# Patient Record
Sex: Female | Born: 1975 | Race: Black or African American | Hispanic: No | Marital: Single | Smoking: Never smoker
Health system: Southern US, Community
[De-identification: ages and names within clinical notes are randomized; demographics above are authoritative.]

## PROBLEM LIST (undated history)

## (undated) DIAGNOSIS — M5136 Other intervertebral disc degeneration, lumbar region: Secondary | ICD-10-CM

## (undated) DIAGNOSIS — M5126 Other intervertebral disc displacement, lumbar region: Secondary | ICD-10-CM

## (undated) DIAGNOSIS — N200 Calculus of kidney: Secondary | ICD-10-CM

## (undated) DIAGNOSIS — M51369 Other intervertebral disc degeneration, lumbar region without mention of lumbar back pain or lower extremity pain: Secondary | ICD-10-CM

## (undated) HISTORY — PX: ROTATOR CUFF REPAIR: SHX139

---

## 2015-03-28 ENCOUNTER — Emergency Department (HOSPITAL_COMMUNITY): Payer: Medicaid - Out of State

## 2015-03-28 ENCOUNTER — Emergency Department (HOSPITAL_COMMUNITY)
Admission: EM | Admit: 2015-03-28 | Discharge: 2015-03-28 | Disposition: A | Discharge status: Home / Self Care | Payer: Medicaid - Out of State | Attending: Emergency Medicine | Admitting: Emergency Medicine

## 2015-03-28 ENCOUNTER — Encounter (HOSPITAL_COMMUNITY): Payer: Self-pay | Admitting: Emergency Medicine

## 2015-03-28 DIAGNOSIS — Z880 Allergy status to penicillin: Secondary | ICD-10-CM | POA: Insufficient documentation

## 2015-03-28 DIAGNOSIS — Z79899 Other long term (current) drug therapy: Secondary | ICD-10-CM | POA: Insufficient documentation

## 2015-03-28 DIAGNOSIS — Z87442 Personal history of urinary calculi: Secondary | ICD-10-CM | POA: Diagnosis not present

## 2015-03-28 DIAGNOSIS — M545 Low back pain, unspecified: Secondary | ICD-10-CM

## 2015-03-28 DIAGNOSIS — M5126 Other intervertebral disc displacement, lumbar region: Secondary | ICD-10-CM | POA: Insufficient documentation

## 2015-03-28 HISTORY — DX: Other intervertebral disc degeneration, lumbar region: M51.36

## 2015-03-28 HISTORY — DX: Calculus of kidney: N20.0

## 2015-03-28 HISTORY — DX: Other intervertebral disc degeneration, lumbar region without mention of lumbar back pain or lower extremity pain: M51.369

## 2015-03-28 HISTORY — DX: Other intervertebral disc displacement, lumbar region: M51.26

## 2015-03-28 LAB — URINALYSIS, ROUTINE W REFLEX MICROSCOPIC
Bilirubin Urine: NEGATIVE
Glucose, UA: NEGATIVE mg/dL
Ketones, ur: NEGATIVE mg/dL
Leukocytes, UA: NEGATIVE
NITRITE: NEGATIVE
PROTEIN: NEGATIVE mg/dL
Specific Gravity, Urine: 1.009 (ref 1.005–1.030)
pH: 6.5 (ref 5.0–8.0)

## 2015-03-28 LAB — CBC
HEMATOCRIT: 36.4 % (ref 36.0–46.0)
HEMOGLOBIN: 12.5 g/dL (ref 12.0–15.0)
MCH: 32.3 pg (ref 26.0–34.0)
MCHC: 34.3 g/dL (ref 30.0–36.0)
MCV: 94.1 fL (ref 78.0–100.0)
Platelets: 273 10*3/uL (ref 150–400)
RBC: 3.87 MIL/uL (ref 3.87–5.11)
RDW: 12.4 % (ref 11.5–15.5)
WBC: 7.5 10*3/uL (ref 4.0–10.5)

## 2015-03-28 LAB — BASIC METABOLIC PANEL
Anion gap: 8 (ref 5–15)
BUN: <5 mg/dL — ABNORMAL LOW (ref 6–20)
CALCIUM: 9.3 mg/dL (ref 8.9–10.3)
CO2: 25 mmol/L (ref 22–32)
Chloride: 108 mmol/L (ref 101–111)
Creatinine, Ser: 0.63 mg/dL (ref 0.44–1.00)
GLUCOSE: 93 mg/dL (ref 65–99)
POTASSIUM: 3.9 mmol/L (ref 3.5–5.1)
SODIUM: 141 mmol/L (ref 135–145)

## 2015-03-28 LAB — I-STAT CG4 LACTIC ACID, ED
LACTIC ACID, VENOUS: 1.45 mmol/L (ref 0.5–2.0)
LACTIC ACID, VENOUS: 1.76 mmol/L (ref 0.5–2.0)

## 2015-03-28 LAB — URINE MICROSCOPIC-ADD ON

## 2015-03-28 LAB — I-STAT BETA HCG BLOOD, ED (MC, WL, AP ONLY)

## 2015-03-28 MED ORDER — OXYCODONE-ACETAMINOPHEN 5-325 MG PO TABS: 1.0000 | ORAL_TABLET | ORAL | Status: AC | PRN

## 2015-03-28 MED ORDER — OXYCODONE-ACETAMINOPHEN 5-325 MG PO TABS
1.0000 | ORAL_TABLET | Freq: Once | ORAL | Status: AC
  Administered 2015-03-28: 1 via ORAL
  Filled 2015-03-28: qty 1

## 2015-03-28 MED ORDER — OXYCODONE-ACETAMINOPHEN 5-325 MG PO TABS
ORAL_TABLET | ORAL | Status: AC
  Filled 2015-03-28: qty 1

## 2015-03-28 MED ORDER — KETOROLAC TROMETHAMINE 60 MG/2ML IM SOLN
60.0000 mg | Freq: Once | INTRAMUSCULAR | Status: AC
  Administered 2015-03-28: 60 mg via INTRAMUSCULAR
  Filled 2015-03-28: qty 2

## 2015-03-28 MED ORDER — OXYCODONE-ACETAMINOPHEN 5-325 MG PO TABS
1.0000 | ORAL_TABLET | Freq: Once | ORAL | Status: AC
  Administered 2015-03-28: 1 via ORAL

## 2015-03-28 MED ORDER — PREDNISONE 20 MG PO TABS: 40.0000 mg | ORAL_TABLET | Freq: Every day | ORAL | Status: DC

## 2015-03-28 NOTE — ED Notes (Signed)
Her pain is not much better.  She still appears to be upset.

## 2015-03-28 NOTE — ED Notes (Signed)
The pt returned from mri 

## 2015-03-28 NOTE — ED Notes (Signed)
To mri 

## 2015-03-28 NOTE — ED Notes (Signed)
Pt still crying.  

## 2015-03-28 NOTE — ED Notes (Signed)
The pt is crying  She is in pain but she just found a job  And she has not worked many days before this happened.  Pain meds given

## 2015-03-28 NOTE — ED Notes (Signed)
Pt crying -- due to pain, flank pain, with back pain and numbness down right leg. Has hx of kidney stones, hx of "bulging discs"

## 2015-03-28 NOTE — Discharge Instructions (Signed)
Take the prescribed medication as directed. Follow-up with Dr. Jeral FruitBotero (neurosurgery)-- call tomorrow morning to make appt. Return to the ED for new or worsening symptoms.

## 2015-03-28 NOTE — ED Provider Notes (Signed)
Patient received in sign out from Georgia Nadeau at shift change.  Briefly, 39 year old female here with low back pain, radiation into right leg.  Symptoms suspicious for sciatica.  No bowel/bladder changes but does appear to have some weakness on the right vs poor effort due to pain.  Plan:  Obtain MRI lumbar spine.  If negative d/c home with pain meds.  Will need to ambulate prior to discharge.  Results for orders placed or performed during the hospital encounter of 03/28/15  Urinalysis, Routine w reflex microscopic-may I&O cath if menses (not at [facility])  Result Value Ref Range   Color, Urine YELLOW YELLOW   APPearance HAZY (A) CLEAR   Specific Gravity, Urine 1.009 1.005 - 1.030   pH 6.5 5.0 - 8.0   Glucose, UA NEGATIVE NEGATIVE mg/dL   Hgb urine dipstick SMALL (A) NEGATIVE   Bilirubin Urine NEGATIVE NEGATIVE   Ketones, ur NEGATIVE NEGATIVE mg/dL   Protein, ur NEGATIVE NEGATIVE mg/dL   Nitrite NEGATIVE NEGATIVE   Leukocytes, UA NEGATIVE NEGATIVE  Basic metabolic panel  Result Value Ref Range   Sodium 141 135 - 145 mmol/L   Potassium 3.9 3.5 - 5.1 mmol/L   Chloride 108 101 - 111 mmol/L   CO2 25 22 - 32 mmol/L   Glucose, Bld 93 65 - 99 mg/dL   BUN <5 (L) 6 - 20 mg/dL   Creatinine, Ser 1.61 0.44 - 1.00 mg/dL   Calcium 9.3 8.9 - 09.6 mg/dL   GFR calc non Af Amer >60 >60 mL/min   GFR calc Af Amer >60 >60 mL/min   Anion gap 8 5 - 15  CBC  Result Value Ref Range   WBC 7.5 4.0 - 10.5 K/uL   RBC 3.87 3.87 - 5.11 MIL/uL   Hemoglobin 12.5 12.0 - 15.0 g/dL   HCT 04.5 40.9 - 81.1 %   MCV 94.1 78.0 - 100.0 fL   MCH 32.3 26.0 - 34.0 pg   MCHC 34.3 30.0 - 36.0 g/dL   RDW 91.4 78.2 - 95.6 %   Platelets 273 150 - 400 K/uL  Urine microscopic-add on  Result Value Ref Range   Squamous Epithelial / LPF 6-30 (A) NONE SEEN   WBC, UA 0-5 0 - 5 WBC/hpf   RBC / HPF 0-5 0 - 5 RBC/hpf   Bacteria, UA RARE (A) NONE SEEN  I-Stat beta hCG blood, ED ([facility], [facility], [facility] only)  Result Value Ref Range   I-stat hCG,  quantitative <5.0 <5 mIU/mL   Comment 3          I-Stat CG4 Lactic Acid, ED  Result Value Ref Range   Lactic Acid, Venous 1.45 0.5 - 2.0 mmol/L  I-Stat CG4 Lactic Acid, ED  Result Value Ref Range   Lactic Acid, Venous 1.76 0.5 - 2.0 mmol/L   Dg Lumbar Spine Complete  03/28/2015  CLINICAL DATA:  Patient with flank pain as well as back pain and numbness down the right leg. History of renal stones. EXAM: LUMBAR SPINE - COMPLETE 4+ VIEW COMPARISON:  None. FINDINGS: Normal anatomic alignment. No evidence for acute fracture dislocation. Vertebral body and intervertebral disc space heights are preserved. SI joints are unremarkable. IMPRESSION: No acute osseous abnormality. Electronically Signed   By: Annia Belt M.D.   On: 03/28/2015 19:18   Mr Lumbar Spine Wo Contrast  03/28/2015  CLINICAL DATA:  39 year female with severe lumbar back and flank pain radiating to the right lower extremity since yesterday after pushing a  cart. Spinal injection several weeks ago. Subsequent encounter. EXAM: MRI LUMBAR SPINE WITHOUT CONTRAST TECHNIQUE: Multiplanar, multisequence MR imaging of the lumbar spine was performed. No intravenous contrast was administered. COMPARISON:  Lumbar radiographs 1848 hours today. FINDINGS: Normal lumbar segmentation demonstrated on the comparison today. Normal vertebral height and alignment. No marrow edema or evidence of acute osseous abnormality. Visualized lower thoracic spinal cord is normal with conus medularis at L1-L2. Negative cauda equina nerve roots. Visualized abdominal viscera and paraspinal soft tissues are within normal limits. Visualized kidneys and proximal ureters are normal. T11-T12:  Negative. T12-L1:  Negative. L1-L2:  Negative. L2-L3: Negative disc. Borderline to mild facet hypertrophy. No stenosis. L3-L4:  Negative disc.  Mild facet hypertrophy.  No stenosis. L4-L5: Subtle left subarticular disc protrusion best seen on series 3, image 9. Mild facet and ligament flavum  hypertrophy. No stenosis. L5-S1: Negative disc. Borderline to mild facet hypertrophy. No stenosis. 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 Mild facet degeneration. No lumbar spinal stenosis or source of right side radiculitis identified. Electronically Signed   By: Odessa FlemingH  Hall M.D.   On: 03/28/2015 21:09    MRI with L4-L5 disc protrusion to the left, which is somewhat unusual since all of her symptoms are on the right.  She remains without any bowel/bladder incontinence.  I have discussed patient's results with her-- she states she is aware of the disc herniation on left but has never seen a specialist.  Patient was able to ambulate in room with myself and nurse tech.  States it is painful but she is able to walk and sit upright which she could not do earlier.  Will d/c home with pain meds, prednisone.  Referred to neurosurgery.  Discussed plan with patient, he/she acknowledged understanding and agreed with plan of care.  Return precautions given for new or worsening symptoms.  Garlon HatchetLisa M Caylee Vlachos, PA-C 03/28/15 [id number]  Zadie Rhineonald Wickline, MD 03/28/15 [phone]

## 2015-03-28 NOTE — ED Provider Notes (Signed)
CSN: [id number]     Arrival date & time 03/28/15  1338 History   First MD Initiated Contact with Patient 03/28/15 1759     Chief Complaint  Patient presents with  . Flank Pain  . Back Pain     (Consider location/radiation/quality/duration/timing/severity/associated sxs/prior Treatment) HPI   Patient is a 39 year old female with past medical history of kidney stones and sciatica who presents to the ED with complaint of worsening back pain. Patient reports yesterday she started having worsening constant sharp pain to her lower back and notes that the pain radiates down the backside of her right leg. She notes pain is worse with movement or putting pressure on her right buttocks/leg. Pt denies fever, numbness, tingling, saddle anesthesia, loss of bowel or bladder, abdominal pain, nausea, vomiting, diarrhea, constipation, weakness, IVDU, cancer or recent spinal manipulation. Denies taking any medications prior to arrival. Patient reports her pain feels similar to episodes of sciatica she has had in the past but notes that this episode is severely worse. Denies any recent fall, trauma, injury. 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  Past Medical History  Diagnosis Date  . Bulging lumbar disc   . Kidney stone    Past Surgical History  Procedure Laterality Date  . Rotator cuff repair     No family history on file. Social History  Substance Use Topics  . Smoking status: Never Smoker   . Smokeless tobacco: None  . Alcohol Use: No   OB History    No data available     Review of Systems  Musculoskeletal: Positive for back pain.  Neurological: Positive for weakness and numbness.      Allergies  Penicillins  Home Medications   Prior to Admission medications   Medication Sig Start Date End Date Taking? Authorizing Provider  cyclobenzaprine (FLEXERIL) 10 MG tablet Take 10 mg by mouth 3 (three) times daily as needed for muscle spasms.   Yes  Historical Provider, MD  nortriptyline (PAMELOR) 25 MG capsule Take 25 mg by mouth at bedtime.   Yes Historical Provider, MD   BP 96/63 mmHg  Pulse 86  Temp(Src) 98.8 F (37.1 C) (Rectal)  Resp 20  SpO2 100% Physical Exam  Constitutional: She is oriented to person, place, and time. She appears well-developed and well-nourished.  Pt appears to be in mild discomfort.  HENT:  Head: Normocephalic and atraumatic.  Mouth/Throat: Oropharynx is clear and moist. No oropharyngeal exudate.  Eyes: Conjunctivae and EOM are normal. Pupils are equal, round, and reactive to light. Right eye exhibits no discharge. Left eye exhibits no discharge. No scleral icterus.  Neck: Normal range of motion. Neck supple.  Cardiovascular: Normal rate, regular rhythm, normal heart sounds and intact distal pulses.   Pulmonary/Chest: Effort normal and breath sounds normal. No respiratory distress. She has no wheezes. She has no rales. She exhibits no tenderness.  Abdominal: Soft. Bowel sounds are normal. She exhibits no distension and no mass. There is no tenderness. There is no rebound, no guarding and no CVA tenderness.  Musculoskeletal: She exhibits tenderness. She exhibits no edema.  No C/T midline tenderness. TTP at midline lumbar and bilateral paraspinal muscles. Dec ROM of back due to pain. Pt only able to slightly lift her right left up from the bed due to reported severe pain, she is able to wiggle toes and flex/extend ankle, sensation intact. 2+ PT pulses. Cap refill <2.   Lymphadenopathy:  She has no cervical adenopathy.  Neurological: She is alert and oriented to person, place, and time.  Skin: Skin is warm and dry.  Nursing note and vitals reviewed.   ED Course  Procedures (including critical care time) Labs Review Labs Reviewed  URINALYSIS, ROUTINE W REFLEX MICROSCOPIC (NOT AT [facility]) - Abnormal; Notable for the following:    APPearance HAZY (*)    Hgb urine dipstick SMALL (*)    All other components  within normal limits  BASIC METABOLIC PANEL - Abnormal; Notable for the following:    BUN <5 (*)    All other components within normal limits  URINE MICROSCOPIC-ADD ON - Abnormal; Notable for the following:    Squamous Epithelial / LPF 6-30 (*)    Bacteria, UA RARE (*)    All other components within normal limits  URINE CULTURE  CBC  I-STAT BETA HCG BLOOD, ED ([facility], [facility], [facility] ONLY)  I-STAT CG4 LACTIC ACID, ED  I-STAT CG4 LACTIC ACID, ED    Imaging Review Dg Lumbar Spine Complete  03/28/2015  CLINICAL DATA:  Patient with flank pain as well as back pain and numbness down the right leg. History of renal stones. EXAM: LUMBAR SPINE - COMPLETE 4+ VIEW COMPARISON:  None. FINDINGS: Normal anatomic alignment. No evidence for acute fracture dislocation. Vertebral body and intervertebral disc space heights are preserved. SI joints are unremarkable. IMPRESSION: No acute osseous abnormality. Electronically Signed   By: Annia Beltrew  Davis M.D.   On: 03/28/2015 19:18   I have personally reviewed and evaluated these images and lab results as part of my medical decision-making.    MDM   Final diagnoses:  Lower back pain    Patient presents with worsening lower back pain that radiates down her right leg with associated numbness. Denies any recent fall, trauma, injury. Endorses history of sciatica and reports this episode of pain feels similar however she notes is significantly worse than episode she has had in the past. No back pain red flags. VSS. Exam revealed tenderness at midline lumbar spine and bilateral lumbar paraspinal muscles, decreased strength and range of motion noted to right lower extremity due to reported pain, sensation intact, 2+ DP pulses, cap refill less than 2, no saddle anesthesia. Patient given pain meds in the ED. pregnancy negative. Labs and UA unremarkable. Lumbar spine x-ray revealed no acute abnormality. Due to pt's presentation and dec. weakness noted on exam, will order MRI for further  evaluation.  Hand-off to Sharilyn SitesLisa Sanders, PA-C. MRI pending.     Satira Sarkicole Elizabeth Paint RockNadeau, [place] JerseyPA-C 03/28/15 2031  Zadie Rhineonald Wickline, MD 03/28/15 2120

## 2015-03-29 LAB — URINE CULTURE: SPECIAL REQUESTS: NORMAL

## 2016-09-27 IMAGING — MR MR LUMBAR SPINE W/O CM
4 of 5 series · 19 of 48 positions shown · non-contrast
Comparison: Lumbar radiographs 6808 hours today.

CLINICAL DATA: 39 year female with severe lumbar back and flank
pain radiating to the right lower extremity since yesterday after
pushing a cart. Spinal injection several weeks ago. Subsequent
encounter.

EXAM:
MRI LUMBAR SPINE WITHOUT CONTRAST
TECHNIQUE: Multiplanar, multisequence MR imaging of the lumbar spine was
performed. No intravenous contrast was administered.

[Series 3: T2 · sagittal · 4.0mm · 0.55mm/px · 5 of 13 slices shown (1 of 2)]
[im 1/13]
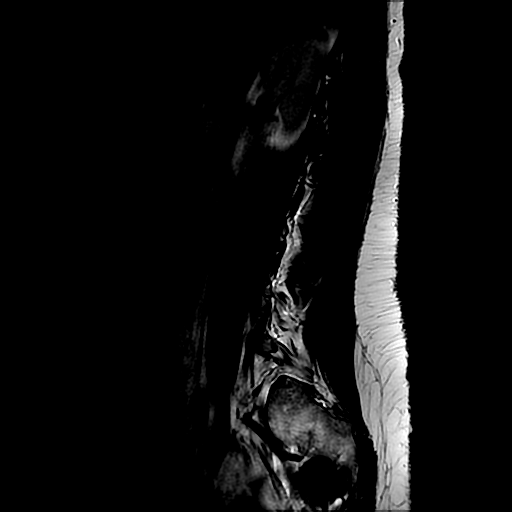
[im 4/13]
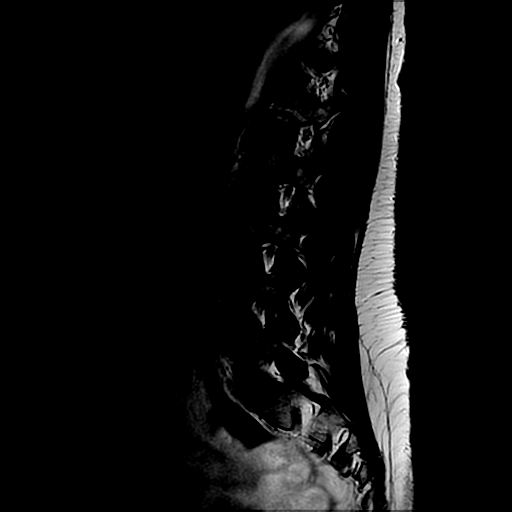
[im 7/13]
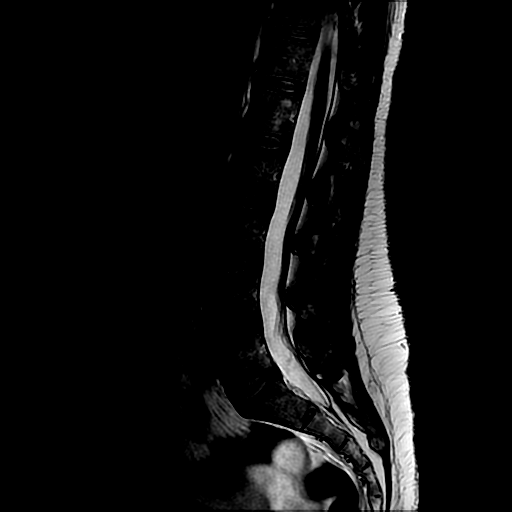
[im 10/13]
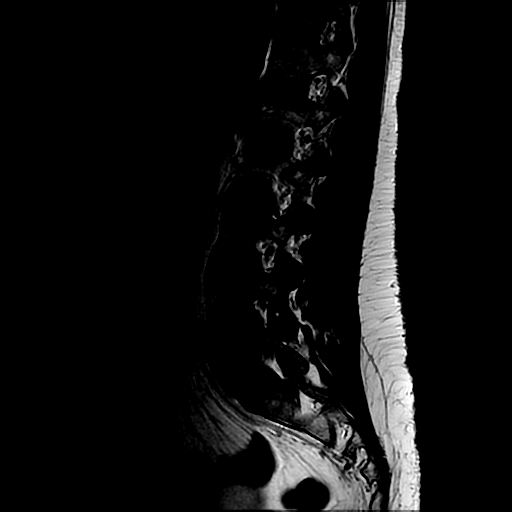
[im 13/13]
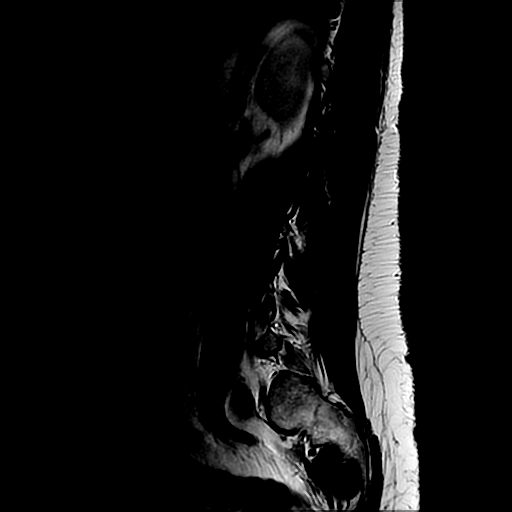

[Series 4: T1 · sagittal · 4.0mm · 0.55mm/px · 3 of 13 slices shown (1 of 2)]
[im 1/13]
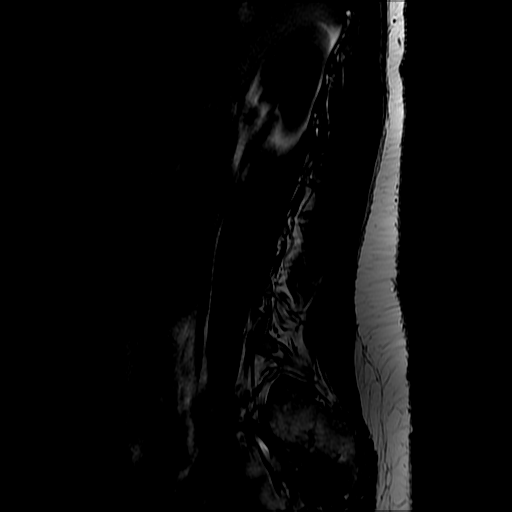
[im 7/13]
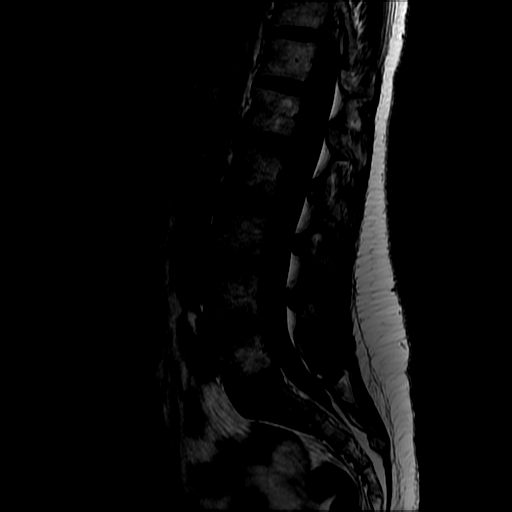
[im 13/13]
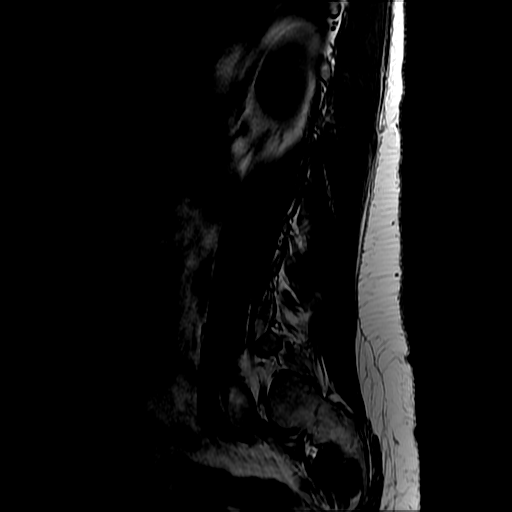

[Series 6: T2 · axial · 4.0mm · 0.39mm/px · z∈[-110,+61]mm · 8 of 36 slices shown (2 of 2)]
[im 3/36]
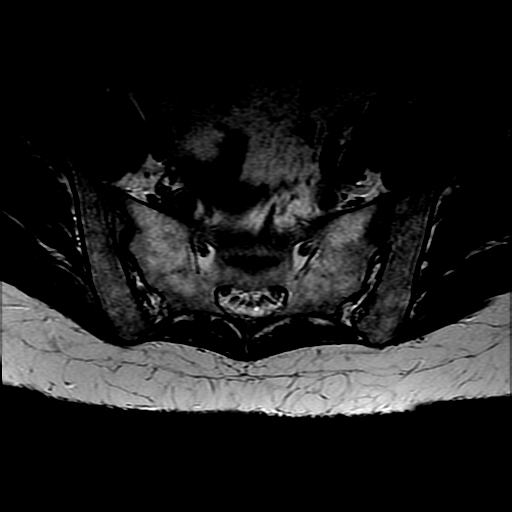
[im 5/36]
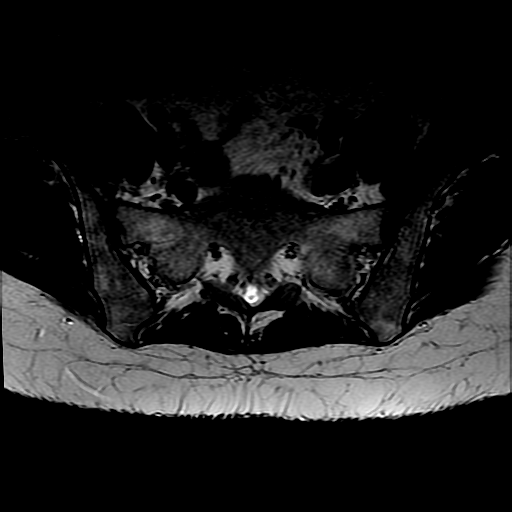
[im 8/36]
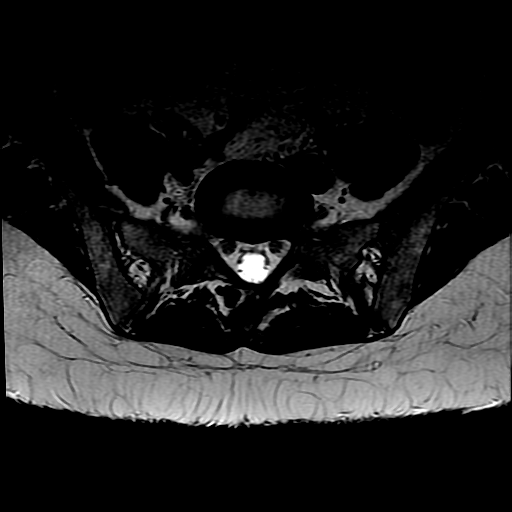
[im 12/36]
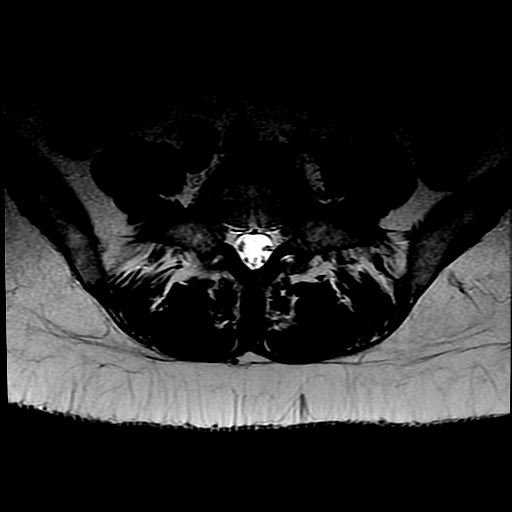
[im 17/36]
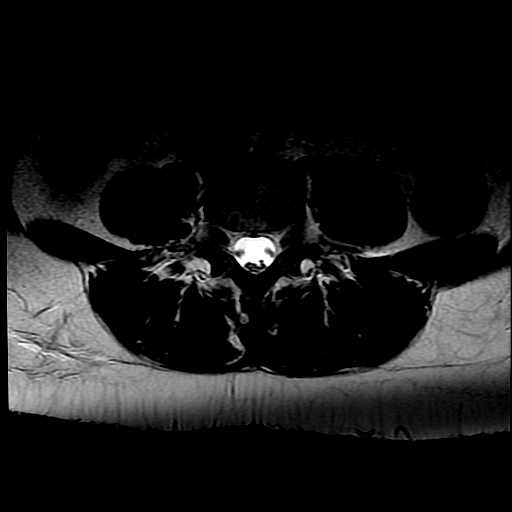
[im 19/36]
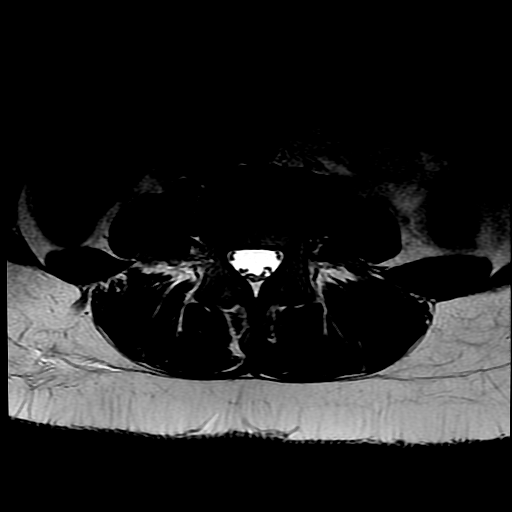
[im 22/36]
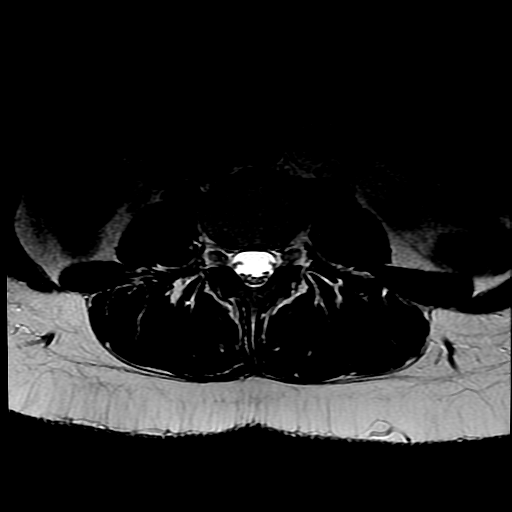
[im 31/36]
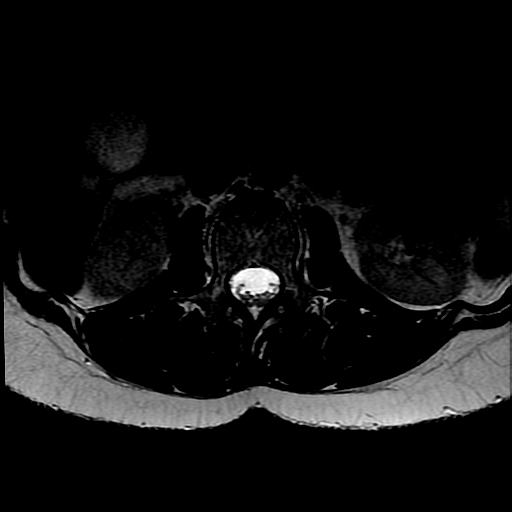

[Series 7: T1 · axial · 4.0mm · 0.39mm/px · z∈[-100,+61]mm · 3 of 36 slices shown (2 of 2)]
[im 5/36]
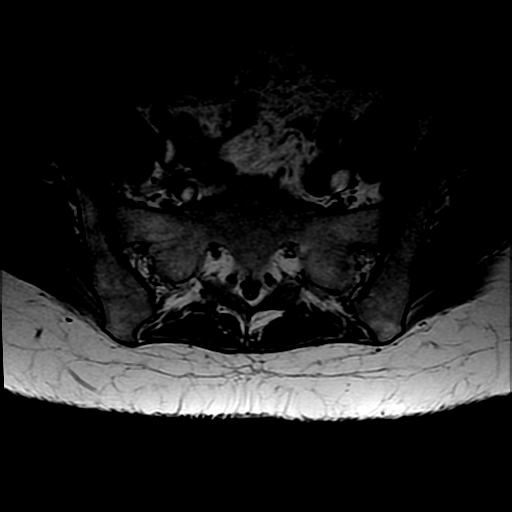
[im 19/36]
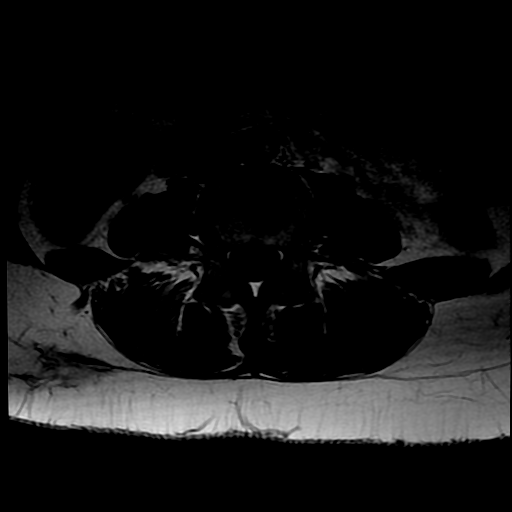
[im 31/36]
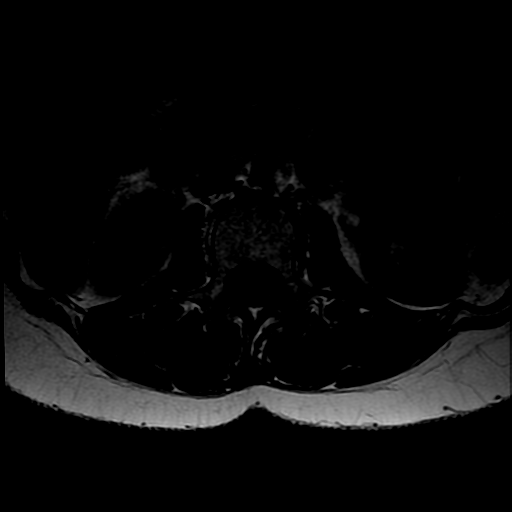

[19 of 48 positions shown; findings below may reference images not displayed]

FINDINGS: Normal lumbar segmentation demonstrated on the comparison today.
Normal vertebral height and alignment. No marrow edema or evidence
of acute osseous abnormality.

Visualized lower thoracic spinal cord is normal with conus medularis
at L1-L2. Negative cauda equina nerve roots.

Visualized abdominal viscera and paraspinal soft tissues are within
normal limits. Visualized kidneys and proximal ureters are normal.

T11-T12:  Negative.

T12-L1:  Negative.

L1-L2:  Negative.

L2-L3: Negative disc. Borderline to mild facet hypertrophy. No
stenosis.

L3-L4:  Negative disc.  Mild facet hypertrophy.  No stenosis.

L4-L5: Subtle left subarticular disc protrusion best seen on series
3, image 9. Mild facet and ligament flavum hypertrophy. No stenosis.

L5-S1: Negative disc. Borderline to mild facet hypertrophy. No
stenosis.
IMPRESSION: Isolated minimal lumbar disc degeneration at L4-L5, with a subtle
left subarticular disc protrusion which might be a source for left
L4 or L5 radiculitis. Mild facet degeneration.

No lumbar spinal stenosis or source of right side radiculitis
identified.

## 2017-04-07 DIAGNOSIS — M461 Sacroiliitis, not elsewhere classified: Secondary | ICD-10-CM | POA: Diagnosis not present

## 2017-04-07 DIAGNOSIS — M545 Low back pain: Secondary | ICD-10-CM | POA: Diagnosis not present

## 2017-04-07 DIAGNOSIS — G8929 Other chronic pain: Secondary | ICD-10-CM | POA: Diagnosis not present

## 2017-06-15 DIAGNOSIS — S6391XA Sprain of unspecified part of right wrist and hand, initial encounter: Secondary | ICD-10-CM | POA: Diagnosis not present

## 2017-06-15 DIAGNOSIS — M25531 Pain in right wrist: Secondary | ICD-10-CM | POA: Diagnosis not present

## 2017-06-15 DIAGNOSIS — M79641 Pain in right hand: Secondary | ICD-10-CM | POA: Diagnosis not present

## 2017-09-18 DIAGNOSIS — M5416 Radiculopathy, lumbar region: Secondary | ICD-10-CM | POA: Diagnosis not present

## 2017-09-18 DIAGNOSIS — M2550 Pain in unspecified joint: Secondary | ICD-10-CM | POA: Diagnosis not present

## 2017-09-18 DIAGNOSIS — E048 Other specified nontoxic goiter: Secondary | ICD-10-CM | POA: Diagnosis not present

## 2017-09-18 DIAGNOSIS — G5700 Lesion of sciatic nerve, unspecified lower limb: Secondary | ICD-10-CM | POA: Diagnosis not present

## 2017-09-18 DIAGNOSIS — G4700 Insomnia, unspecified: Secondary | ICD-10-CM | POA: Diagnosis not present

## 2017-09-22 ENCOUNTER — Other Ambulatory Visit: Payer: Self-pay | Admitting: Endocrinology

## 2017-09-22 DIAGNOSIS — M545 Low back pain, unspecified: Secondary | ICD-10-CM

## 2017-09-22 DIAGNOSIS — G5700 Lesion of sciatic nerve, unspecified lower limb: Secondary | ICD-10-CM

## 2017-10-12 ENCOUNTER — Other Ambulatory Visit: Payer: Medicaid - Out of State

## 2017-12-21 DIAGNOSIS — Z0000 Encounter for general adult medical examination without abnormal findings: Secondary | ICD-10-CM | POA: Diagnosis not present

## 2018-01-10 ENCOUNTER — Emergency Department (HOSPITAL_COMMUNITY)
Admission: EM | Admit: 2018-01-10 | Discharge: 2018-01-10 | Disposition: A | Discharge status: Home / Self Care | Payer: BLUE CROSS/BLUE SHIELD | Attending: Emergency Medicine | Admitting: Emergency Medicine

## 2018-01-10 ENCOUNTER — Encounter (HOSPITAL_COMMUNITY): Payer: Self-pay | Admitting: Internal Medicine

## 2018-01-10 DIAGNOSIS — M549 Dorsalgia, unspecified: Secondary | ICD-10-CM | POA: Diagnosis present

## 2018-01-10 DIAGNOSIS — M5489 Other dorsalgia: Secondary | ICD-10-CM | POA: Diagnosis not present

## 2018-01-10 DIAGNOSIS — R52 Pain, unspecified: Secondary | ICD-10-CM | POA: Diagnosis not present

## 2018-01-10 DIAGNOSIS — M5126 Other intervertebral disc displacement, lumbar region: Secondary | ICD-10-CM | POA: Diagnosis not present

## 2018-01-10 DIAGNOSIS — Z79899 Other long term (current) drug therapy: Secondary | ICD-10-CM | POA: Insufficient documentation

## 2018-01-10 DIAGNOSIS — M5136 Other intervertebral disc degeneration, lumbar region: Secondary | ICD-10-CM

## 2018-01-10 DIAGNOSIS — M5441 Lumbago with sciatica, right side: Secondary | ICD-10-CM | POA: Diagnosis not present

## 2018-01-10 DIAGNOSIS — M545 Low back pain: Secondary | ICD-10-CM | POA: Diagnosis not present

## 2018-01-10 DIAGNOSIS — I959 Hypotension, unspecified: Secondary | ICD-10-CM | POA: Diagnosis not present

## 2018-01-10 MED ORDER — DEXAMETHASONE SODIUM PHOSPHATE 10 MG/ML IJ SOLN
10.0000 mg | Freq: Once | INTRAMUSCULAR | Status: AC
  Administered 2018-01-10: 10 mg via INTRAVENOUS
  Filled 2018-01-10: qty 1

## 2018-01-10 MED ORDER — PREDNISONE 10 MG (21) PO TBPK: ORAL_TABLET | Freq: Every day | ORAL | 0 refills | Status: AC

## 2018-01-10 MED ORDER — SODIUM CHLORIDE 0.9 % IV BOLUS
1000.0000 mL | Freq: Once | INTRAVENOUS | Status: AC
  Administered 2018-01-10: 1000 mL via INTRAVENOUS

## 2018-01-10 MED ORDER — HYDROMORPHONE HCL 1 MG/ML IJ SOLN: 0.5000 mg | Freq: Once | INTRAMUSCULAR | Status: DC | PRN

## 2018-01-10 MED ORDER — FENTANYL CITRATE (PF) 100 MCG/2ML IJ SOLN
100.0000 ug | Freq: Once | INTRAMUSCULAR | Status: AC
  Administered 2018-01-10: 100 ug via INTRAVENOUS
  Filled 2018-01-10: qty 2

## 2018-01-10 MED ORDER — HYDROMORPHONE HCL 1 MG/ML IJ SOLN
0.5000 mg | Freq: Once | INTRAMUSCULAR | Status: AC
  Administered 2018-01-10: 0.5 mg via INTRAVENOUS
  Filled 2018-01-10: qty 1

## 2018-01-10 MED ORDER — LIDOCAINE 5 % EX PTCH
1.0000 | MEDICATED_PATCH | Freq: Once | CUTANEOUS | Status: DC
  Administered 2018-01-10: 1 via TRANSDERMAL
  Filled 2018-01-10: qty 1

## 2018-01-10 NOTE — Discharge Instructions (Signed)
Return to the emergency room if you develop severe worsening back pain.  Return if you have fevers, loss of bowel bladder control, or numbness.  In the thighs. Otherwise continue taking her home medication and follow-up with your primary care doctor.  Prednisone as prescribed.

## 2018-01-10 NOTE — ED Triage Notes (Signed)
Pt here for evaluation of chronic back pain d/t previous injuries with herniated disks. Hx sciatica as well. Pt states she was unable to get out of bed this morning at around 6am d/t pain. 10/10 lower back pain. Pt took 5mg  percocet and 10mg  flexeriil prior to EMS arrival.

## 2018-01-10 NOTE — ED Provider Notes (Signed)
[facility] EMERGENCY DEPARTMENT Provider Note   CSN: [id number] Arrival date & time: 01/10/18  1118     History   Chief Complaint Chief Complaint  Patient presents with  . Back Pain    HPI Marissa Jones is a 42 y.o. female resenting for evaluation of back pain.  Patient states she has a history of bulging disc and sciatica.  This is normally managed with Percocet and muscle relaxers.  However, this morning her pain was significantly worse.  She took her home medication, but is unable to walk due to the pain.  She denies recent fall, trauma, or injury.  Pain is across her low back, and radiates down her right leg.  She denies fevers, chills, rash, history of cancer, history of IV drug use, loss of bowel or bladder control.  She denies numbness or tingling.  She denies chest pain, shortness breath, nausea, vomiting, abdominal pain, urinary symptoms, abnormal bowel movements.  Patient states she moved to [place] 2 years ago from [place].  She has established care with a primary care doctor in [place], is working on getting a newer MRI so that she can get the necessary surgery to fix her back.  Currently, pain control is still managed by her doctor in [place].  HPI  Past Medical History:  Diagnosis Date  . Bulging lumbar disc   . Kidney stone     There are no active problems to display for this patient.   Past Surgical History:  Procedure Laterality Date  . ROTATOR CUFF REPAIR       OB History   None      Home Medications    Prior to Admission medications   Medication Sig Start Date End Date Taking? Authorizing Provider  ALPRAZolam Prudy Feeler) 0.5 MG tablet Take 0.5 mg by mouth at bedtime. 12/31/17  Yes [provider]  cyclobenzaprine (FLEXERIL) 10 MG tablet Take 10 mg by mouth 3 (three) times daily as needed for muscle spasms.   Yes [provider]  oxyCODONE-acetaminophen (PERCOCET/ROXICET) 5-325 MG tablet Take 1  tablet by mouth every 4 (four) hours as needed. Patient taking differently: Take 1 tablet by mouth every 4 (four) hours as needed for moderate pain.  03/28/15  Yes Garlon Hatchet, PA-C  vitamin B-12 (CYANOCOBALAMIN) 1000 MCG tablet Take 1,000 mcg by mouth daily.   Yes [provider]  Vitamin D, Ergocalciferol, (DRISDOL) 50000 units CAPS capsule Take 50,000 Units by mouth every 30 (thirty) days.   Yes [provider]  predniSONE (STERAPRED UNI-PAK 21 TAB) 10 MG (21) TBPK tablet Take by mouth daily. Take 6 tabs by mouth daily  for 2 days, then 5 tabs for 2 days, then 4 tabs for 2 days, then 3 tabs for 2 days, 2 tabs for 2 days, then 1 tab by mouth daily for 2 days 01/10/18   Lexus Barletta, PA-C    Family History No family history on file.  Social History Social History   Tobacco Use  . Smoking status: Never Smoker  Substance Use Topics  . Alcohol use: No  . Drug use: No     Allergies   Penicillins   Review of Systems Review of Systems  Musculoskeletal: Positive for back pain.  All other systems reviewed and are negative.    Physical Exam Updated Vital Signs BP 101/62   Pulse 76   Temp 98.6 F (37 C) (Oral)   Resp 16   Ht 5\' 2"  (  1.575 m)   Wt 52.2 kg   LMP 01/06/2018   SpO2 99%   BMI 21.03 kg/m   Physical Exam  Constitutional: She is oriented to person, place, and time. She appears well-developed and well-nourished.  Patient appears very uncomfortable due to pain.  Crying in the exam room.  HENT:  Head: Normocephalic and atraumatic.  Eyes: Pupils are equal, round, and reactive to light. Conjunctivae and EOM are normal.  Neck: Normal range of motion. Neck supple.  Cardiovascular: Normal rate, regular rhythm and intact distal pulses.  Pulmonary/Chest: Effort normal and breath sounds normal. No respiratory distress. She has no wheezes.  Abdominal: Soft. She exhibits no distension and no mass. There is no tenderness. There is no rebound and no  guarding.  Musculoskeletal: Normal range of motion.  TTP of bilateral lower back pain and midline spine. Strength intact x4. Sensation intact x4.   Neurological: She is alert and oriented to person, place, and time. No sensory deficit.  No obvious neurologic deficits.  No saddle paresthesias.  Skin: Skin is warm and dry. Capillary refill takes less than 2 seconds.  Psychiatric: She has a normal mood and affect.  Nursing note and vitals reviewed.    ED Treatments / Results  Labs (all labs ordered are listed, but only abnormal results are displayed) Labs Reviewed - No data to display  EKG None  Radiology No results found.  Procedures Procedures (including critical care time)  Medications Ordered in ED Medications  lidocaine (LIDODERM) 5 % 1 patch (1 patch Transdermal Patch Applied 01/10/18 1359)  HYDROmorphone (DILAUDID) injection 0.5 mg (has no administration in time range)  fentaNYL (SUBLIMAZE) injection 100 mcg (100 mcg Intravenous Given 01/10/18 1246)  sodium chloride 0.9 % bolus 1,000 mL (0 mLs Intravenous Stopped 01/10/18 1313)  dexamethasone (DECADRON) injection 10 mg (10 mg Intravenous Given 01/10/18 1230)  sodium chloride 0.9 % bolus 1,000 mL (0 mLs Intravenous Stopped 01/10/18 1509)  HYDROmorphone (DILAUDID) injection 0.5 mg (0.5 mg Intravenous Given 01/10/18 1541)     Initial Impression / Assessment and Plan / ED Course  I have reviewed the triage vital signs and the nursing notes.  Pertinent labs & imaging results that were available during my care of the patient were reviewed by me and considered in my medical decision making (see chart for details).     Patient presenting for evaluation of back pain.  Physical exam reassuring, no obvious neurologic deficits.  No bowel/bladder incontinence or saddle paresthesias.  Doubt cauda equina syndrome.  Patient with a history of bulging disc and right-sided sciatica.  Patient states this feels similar to her normal pain  just much more severe.  Of note, blood pressure is low, likely related to prehospital medications.  Will give fluid bolus, Decadron, and treat pain and reassess.  On reassessment, patient reports pain is improving.  Blood pressure remains mildly low, but stable.  Will give another fluid bolus and reassess.  Patient reports pain continues to improve.  Blood pressure also improving.  Pain control continuing. Will give 0.5 dilaudid and try to ambulate.   Patient reports continued pain control.  Is now sitting up with minimal pain.  Patient ambulated.  Patient at this time, patient proceed for discharge.  Doubt vertebral fx, infection, new spinal cord compression,or myelopathy. Pt given pred pack and will f/u with her PCP. At this time, pt appears safe for d/c. Return precautions given. Pt states she understands and agrees to plan.    Final Clinical Impressions(s) /  ED Diagnoses   Final diagnoses:  Acute midline low back pain with right-sided sciatica  Bulging lumbar disc    ED Discharge Orders         Ordered    predniSONE (STERAPRED UNI-PAK 21 TAB) 10 MG (21) TBPK tablet  Daily     01/10/18 1606           Xian Alves, PA-C 01/10/18 1613    Arby Barrette, MD 01/22/18 2124

## 2018-01-10 NOTE — ED Notes (Signed)
Patient verbalizes understanding of discharge instructions. Opportunity for questioning and answers were provided. Armband removed by staff, pt discharged from ED in wheelchair with family.  

## 2018-01-10 NOTE — ED Notes (Signed)
Pt ambulatory with standby assist.

## 2018-03-26 DIAGNOSIS — F419 Anxiety disorder, unspecified: Secondary | ICD-10-CM | POA: Diagnosis not present

## 2018-03-26 DIAGNOSIS — M5416 Radiculopathy, lumbar region: Secondary | ICD-10-CM | POA: Diagnosis not present

## 2018-03-26 DIAGNOSIS — Z6820 Body mass index (BMI) 20.0-20.9, adult: Secondary | ICD-10-CM | POA: Diagnosis not present

## 2018-03-26 DIAGNOSIS — G5700 Lesion of sciatic nerve, unspecified lower limb: Secondary | ICD-10-CM | POA: Diagnosis not present

## 2018-04-04 ENCOUNTER — Ambulatory Visit
Admission: RE | Admit: 2018-04-04 | Discharge: 2018-04-04 | Disposition: A | Discharge status: Home / Self Care | Payer: BLUE CROSS/BLUE SHIELD | Source: Ambulatory Visit | Attending: Endocrinology | Admitting: Endocrinology

## 2018-04-04 DIAGNOSIS — M5126 Other intervertebral disc displacement, lumbar region: Secondary | ICD-10-CM | POA: Diagnosis not present

## 2018-04-04 DIAGNOSIS — M545 Low back pain, unspecified: Secondary | ICD-10-CM

## 2018-04-04 DIAGNOSIS — G5700 Lesion of sciatic nerve, unspecified lower limb: Secondary | ICD-10-CM

## 2018-04-04 MED ORDER — GADOBENATE DIMEGLUMINE 529 MG/ML IV SOLN
10.0000 mL | Freq: Once | INTRAVENOUS | Status: AC | PRN
  Administered 2018-04-04: 10 mL via INTRAVENOUS

## 2018-04-05 DIAGNOSIS — M5416 Radiculopathy, lumbar region: Secondary | ICD-10-CM | POA: Diagnosis not present

## 2018-04-05 DIAGNOSIS — N390 Urinary tract infection, site not specified: Secondary | ICD-10-CM | POA: Diagnosis not present

## 2018-06-03 DIAGNOSIS — F1190 Opioid use, unspecified, uncomplicated: Secondary | ICD-10-CM | POA: Diagnosis not present

## 2018-06-03 DIAGNOSIS — F1120 Opioid dependence, uncomplicated: Secondary | ICD-10-CM | POA: Diagnosis not present

## 2018-06-03 DIAGNOSIS — M5440 Lumbago with sciatica, unspecified side: Secondary | ICD-10-CM | POA: Diagnosis not present

## 2018-06-03 DIAGNOSIS — M4606 Spinal enthesopathy, lumbar region: Secondary | ICD-10-CM | POA: Diagnosis not present

## 2018-07-06 DIAGNOSIS — G8929 Other chronic pain: Secondary | ICD-10-CM | POA: Diagnosis not present

## 2018-07-06 DIAGNOSIS — M5441 Lumbago with sciatica, right side: Secondary | ICD-10-CM | POA: Diagnosis not present

## 2018-07-22 DIAGNOSIS — F1120 Opioid dependence, uncomplicated: Secondary | ICD-10-CM | POA: Diagnosis not present

## 2018-07-22 DIAGNOSIS — M4606 Spinal enthesopathy, lumbar region: Secondary | ICD-10-CM | POA: Diagnosis not present

## 2018-07-22 DIAGNOSIS — M5440 Lumbago with sciatica, unspecified side: Secondary | ICD-10-CM | POA: Diagnosis not present

## 2018-07-22 DIAGNOSIS — F1190 Opioid use, unspecified, uncomplicated: Secondary | ICD-10-CM | POA: Diagnosis not present

## 2018-08-19 DIAGNOSIS — F1190 Opioid use, unspecified, uncomplicated: Secondary | ICD-10-CM | POA: Diagnosis not present

## 2018-08-19 DIAGNOSIS — F1120 Opioid dependence, uncomplicated: Secondary | ICD-10-CM | POA: Diagnosis not present

## 2018-08-19 DIAGNOSIS — M5440 Lumbago with sciatica, unspecified side: Secondary | ICD-10-CM | POA: Diagnosis not present

## 2018-08-19 DIAGNOSIS — M4606 Spinal enthesopathy, lumbar region: Secondary | ICD-10-CM | POA: Diagnosis not present

## 2018-09-17 DIAGNOSIS — F1120 Opioid dependence, uncomplicated: Secondary | ICD-10-CM | POA: Diagnosis not present

## 2018-09-17 DIAGNOSIS — M4606 Spinal enthesopathy, lumbar region: Secondary | ICD-10-CM | POA: Diagnosis not present

## 2018-09-17 DIAGNOSIS — M5440 Lumbago with sciatica, unspecified side: Secondary | ICD-10-CM | POA: Diagnosis not present

## 2018-09-17 DIAGNOSIS — F1190 Opioid use, unspecified, uncomplicated: Secondary | ICD-10-CM | POA: Diagnosis not present

## 2018-10-15 DIAGNOSIS — G8929 Other chronic pain: Secondary | ICD-10-CM | POA: Diagnosis not present

## 2018-10-15 DIAGNOSIS — M4606 Spinal enthesopathy, lumbar region: Secondary | ICD-10-CM | POA: Diagnosis not present

## 2018-10-15 DIAGNOSIS — M5440 Lumbago with sciatica, unspecified side: Secondary | ICD-10-CM | POA: Diagnosis not present

## 2018-10-15 DIAGNOSIS — F1120 Opioid dependence, uncomplicated: Secondary | ICD-10-CM | POA: Diagnosis not present

## 2018-10-19 DIAGNOSIS — Z20828 Contact with and (suspected) exposure to other viral communicable diseases: Secondary | ICD-10-CM | POA: Diagnosis not present

## 2018-10-26 DIAGNOSIS — Z20828 Contact with and (suspected) exposure to other viral communicable diseases: Secondary | ICD-10-CM | POA: Diagnosis not present

## 2018-11-02 DIAGNOSIS — Z20828 Contact with and (suspected) exposure to other viral communicable diseases: Secondary | ICD-10-CM | POA: Diagnosis not present

## 2018-11-09 DIAGNOSIS — Z20828 Contact with and (suspected) exposure to other viral communicable diseases: Secondary | ICD-10-CM | POA: Diagnosis not present

## 2018-11-23 DIAGNOSIS — Z20828 Contact with and (suspected) exposure to other viral communicable diseases: Secondary | ICD-10-CM | POA: Diagnosis not present

## 2018-12-10 DIAGNOSIS — M4606 Spinal enthesopathy, lumbar region: Secondary | ICD-10-CM | POA: Diagnosis not present

## 2018-12-10 DIAGNOSIS — M5440 Lumbago with sciatica, unspecified side: Secondary | ICD-10-CM | POA: Diagnosis not present

## 2018-12-10 DIAGNOSIS — F1190 Opioid use, unspecified, uncomplicated: Secondary | ICD-10-CM | POA: Diagnosis not present

## 2018-12-10 DIAGNOSIS — F1120 Opioid dependence, uncomplicated: Secondary | ICD-10-CM | POA: Diagnosis not present

## 2018-12-14 DIAGNOSIS — Z20828 Contact with and (suspected) exposure to other viral communicable diseases: Secondary | ICD-10-CM | POA: Diagnosis not present

## 2018-12-21 DIAGNOSIS — Z20828 Contact with and (suspected) exposure to other viral communicable diseases: Secondary | ICD-10-CM | POA: Diagnosis not present

## 2018-12-28 DIAGNOSIS — Z20828 Contact with and (suspected) exposure to other viral communicable diseases: Secondary | ICD-10-CM | POA: Diagnosis not present

## 2019-01-03 DIAGNOSIS — Z20828 Contact with and (suspected) exposure to other viral communicable diseases: Secondary | ICD-10-CM | POA: Diagnosis not present

## 2019-01-07 DIAGNOSIS — F1120 Opioid dependence, uncomplicated: Secondary | ICD-10-CM | POA: Diagnosis not present

## 2019-01-07 DIAGNOSIS — F1190 Opioid use, unspecified, uncomplicated: Secondary | ICD-10-CM | POA: Diagnosis not present

## 2019-01-07 DIAGNOSIS — M5440 Lumbago with sciatica, unspecified side: Secondary | ICD-10-CM | POA: Diagnosis not present

## 2019-01-07 DIAGNOSIS — M4606 Spinal enthesopathy, lumbar region: Secondary | ICD-10-CM | POA: Diagnosis not present

## 2019-01-19 DIAGNOSIS — F419 Anxiety disorder, unspecified: Secondary | ICD-10-CM | POA: Diagnosis not present

## 2019-01-19 DIAGNOSIS — M5416 Radiculopathy, lumbar region: Secondary | ICD-10-CM | POA: Diagnosis not present

## 2019-01-19 DIAGNOSIS — F329 Major depressive disorder, single episode, unspecified: Secondary | ICD-10-CM | POA: Diagnosis not present

## 2019-01-24 DIAGNOSIS — Z20818 Contact with and (suspected) exposure to other bacterial communicable diseases: Secondary | ICD-10-CM | POA: Diagnosis not present

## 2019-01-31 DIAGNOSIS — Z20828 Contact with and (suspected) exposure to other viral communicable diseases: Secondary | ICD-10-CM | POA: Diagnosis not present

## 2019-02-04 DIAGNOSIS — F1190 Opioid use, unspecified, uncomplicated: Secondary | ICD-10-CM | POA: Diagnosis not present

## 2019-02-04 DIAGNOSIS — M5440 Lumbago with sciatica, unspecified side: Secondary | ICD-10-CM | POA: Diagnosis not present

## 2019-02-04 DIAGNOSIS — M4606 Spinal enthesopathy, lumbar region: Secondary | ICD-10-CM | POA: Diagnosis not present

## 2019-02-04 DIAGNOSIS — F1120 Opioid dependence, uncomplicated: Secondary | ICD-10-CM | POA: Diagnosis not present

## 2019-02-07 DIAGNOSIS — Z20828 Contact with and (suspected) exposure to other viral communicable diseases: Secondary | ICD-10-CM | POA: Diagnosis not present

## 2019-02-14 DIAGNOSIS — Z20828 Contact with and (suspected) exposure to other viral communicable diseases: Secondary | ICD-10-CM | POA: Diagnosis not present

## 2019-02-21 DIAGNOSIS — Z20828 Contact with and (suspected) exposure to other viral communicable diseases: Secondary | ICD-10-CM | POA: Diagnosis not present

## 2019-02-28 DIAGNOSIS — Z20828 Contact with and (suspected) exposure to other viral communicable diseases: Secondary | ICD-10-CM | POA: Diagnosis not present

## 2019-03-07 DIAGNOSIS — Z20828 Contact with and (suspected) exposure to other viral communicable diseases: Secondary | ICD-10-CM | POA: Diagnosis not present

## 2019-03-14 DIAGNOSIS — Z20828 Contact with and (suspected) exposure to other viral communicable diseases: Secondary | ICD-10-CM | POA: Diagnosis not present

## 2019-03-21 DIAGNOSIS — Z20828 Contact with and (suspected) exposure to other viral communicable diseases: Secondary | ICD-10-CM | POA: Diagnosis not present

## 2019-03-23 DIAGNOSIS — K2970 Gastritis, unspecified, without bleeding: Secondary | ICD-10-CM | POA: Diagnosis not present

## 2019-03-23 DIAGNOSIS — R110 Nausea: Secondary | ICD-10-CM | POA: Diagnosis not present

## 2019-03-23 DIAGNOSIS — M5416 Radiculopathy, lumbar region: Secondary | ICD-10-CM | POA: Diagnosis not present

## 2019-03-28 DIAGNOSIS — Z20828 Contact with and (suspected) exposure to other viral communicable diseases: Secondary | ICD-10-CM | POA: Diagnosis not present

## 2019-04-04 DIAGNOSIS — Z20828 Contact with and (suspected) exposure to other viral communicable diseases: Secondary | ICD-10-CM | POA: Diagnosis not present

## 2019-04-09 DIAGNOSIS — Z681 Body mass index (BMI) 19 or less, adult: Secondary | ICD-10-CM | POA: Diagnosis not present

## 2019-04-09 DIAGNOSIS — M5489 Other dorsalgia: Secondary | ICD-10-CM | POA: Diagnosis not present

## 2019-04-09 DIAGNOSIS — M5431 Sciatica, right side: Secondary | ICD-10-CM | POA: Diagnosis not present

## 2019-04-11 DIAGNOSIS — Z20828 Contact with and (suspected) exposure to other viral communicable diseases: Secondary | ICD-10-CM | POA: Diagnosis not present

## 2019-09-03 DIAGNOSIS — S63521A Sprain of radiocarpal joint of right wrist, initial encounter: Secondary | ICD-10-CM | POA: Diagnosis not present

## 2019-09-05 DIAGNOSIS — S63521A Sprain of radiocarpal joint of right wrist, initial encounter: Secondary | ICD-10-CM | POA: Diagnosis not present

## 2019-09-30 DIAGNOSIS — F419 Anxiety disorder, unspecified: Secondary | ICD-10-CM | POA: Diagnosis not present

## 2019-09-30 DIAGNOSIS — M5416 Radiculopathy, lumbar region: Secondary | ICD-10-CM | POA: Diagnosis not present

## 2019-11-22 DIAGNOSIS — Z6821 Body mass index (BMI) 21.0-21.9, adult: Secondary | ICD-10-CM | POA: Diagnosis not present

## 2019-11-22 DIAGNOSIS — Z124 Encounter for screening for malignant neoplasm of cervix: Secondary | ICD-10-CM | POA: Diagnosis not present

## 2019-11-22 DIAGNOSIS — Z01419 Encounter for gynecological examination (general) (routine) without abnormal findings: Secondary | ICD-10-CM | POA: Diagnosis not present

## 2019-11-22 DIAGNOSIS — Z1211 Encounter for screening for malignant neoplasm of colon: Secondary | ICD-10-CM | POA: Diagnosis not present

## 2019-11-22 DIAGNOSIS — Z1151 Encounter for screening for human papillomavirus (HPV): Secondary | ICD-10-CM | POA: Diagnosis not present

## 2019-11-22 DIAGNOSIS — N6310 Unspecified lump in the right breast, unspecified quadrant: Secondary | ICD-10-CM | POA: Diagnosis not present

## 2019-11-28 DIAGNOSIS — N6310 Unspecified lump in the right breast, unspecified quadrant: Secondary | ICD-10-CM | POA: Diagnosis not present

## 2019-11-28 DIAGNOSIS — N6001 Solitary cyst of right breast: Secondary | ICD-10-CM | POA: Diagnosis not present

## 2019-12-06 DIAGNOSIS — N6001 Solitary cyst of right breast: Secondary | ICD-10-CM | POA: Diagnosis not present

## 2020-03-13 DIAGNOSIS — Z79899 Other long term (current) drug therapy: Secondary | ICD-10-CM | POA: Diagnosis not present

## 2020-03-13 DIAGNOSIS — R091 Pleurisy: Secondary | ICD-10-CM | POA: Diagnosis not present

## 2020-08-03 DIAGNOSIS — Z0000 Encounter for general adult medical examination without abnormal findings: Secondary | ICD-10-CM | POA: Diagnosis not present
